# Patient Record
Sex: Female | Born: 1990 | Race: Black or African American | Marital: Single | Smoking: Current some day smoker
Health system: Southern US, Community
[De-identification: ages and names within clinical notes are randomized; demographics above are authoritative.]

## PROBLEM LIST (undated history)

## (undated) HISTORY — PX: ANKLE SURGERY: SHX546

---

## 2010-09-17 ENCOUNTER — Emergency Department (HOSPITAL_COMMUNITY): Payer: Medicaid Other

## 2010-09-17 ENCOUNTER — Emergency Department (HOSPITAL_COMMUNITY)
Admission: EM | Admit: 2010-09-17 | Discharge: 2010-09-17 | Disposition: A | Discharge status: Home / Self Care | Payer: Medicaid Other | Attending: Emergency Medicine | Admitting: Emergency Medicine

## 2010-09-17 DIAGNOSIS — N949 Unspecified condition associated with female genital organs and menstrual cycle: Secondary | ICD-10-CM | POA: Insufficient documentation

## 2010-09-17 DIAGNOSIS — R10819 Abdominal tenderness, unspecified site: Secondary | ICD-10-CM | POA: Insufficient documentation

## 2010-09-17 DIAGNOSIS — R102 Pelvic and perineal pain: Secondary | ICD-10-CM

## 2010-09-17 DIAGNOSIS — N898 Other specified noninflammatory disorders of vagina: Secondary | ICD-10-CM | POA: Insufficient documentation

## 2010-09-17 DIAGNOSIS — N72 Inflammatory disease of cervix uteri: Secondary | ICD-10-CM | POA: Insufficient documentation

## 2010-09-17 DIAGNOSIS — R109 Unspecified abdominal pain: Secondary | ICD-10-CM | POA: Insufficient documentation

## 2010-09-17 LAB — GC/CHLAMYDIA PROBE AMP, GENITAL: Chlamydia, DNA Probe: NEGATIVE

## 2010-09-17 LAB — WET PREP, GENITAL
Clue Cells Wet Prep HPF POC: NONE SEEN
Yeast Wet Prep HPF POC: NONE SEEN

## 2010-09-17 LAB — URINALYSIS, ROUTINE W REFLEX MICROSCOPIC
Hgb urine dipstick: NEGATIVE
Ketones, ur: NEGATIVE mg/dL
Protein, ur: NEGATIVE mg/dL
Urine Glucose, Fasting: NEGATIVE mg/dL
pH: 6.5 (ref 5.0–8.0)

## 2010-09-17 LAB — PREGNANCY, URINE: Preg Test, Ur: NEGATIVE

## 2011-03-23 ENCOUNTER — Emergency Department (HOSPITAL_COMMUNITY): Payer: Medicaid Other

## 2011-03-23 ENCOUNTER — Emergency Department (HOSPITAL_COMMUNITY)
Admission: EM | Admit: 2011-03-23 | Discharge: 2011-03-23 | Disposition: A | Discharge status: Home / Self Care | Payer: Medicaid Other | Attending: Emergency Medicine | Admitting: Emergency Medicine

## 2011-03-23 DIAGNOSIS — S335XXA Sprain of ligaments of lumbar spine, initial encounter: Secondary | ICD-10-CM | POA: Insufficient documentation

## 2011-03-23 DIAGNOSIS — X58XXXA Exposure to other specified factors, initial encounter: Secondary | ICD-10-CM | POA: Insufficient documentation

## 2011-03-23 DIAGNOSIS — M545 Low back pain, unspecified: Secondary | ICD-10-CM | POA: Insufficient documentation

## 2011-03-28 ENCOUNTER — Emergency Department (HOSPITAL_COMMUNITY)
Admission: EM | Admit: 2011-03-28 | Discharge: 2011-03-28 | Discharge status: Against Medical Advice | Payer: Medicaid Other | Attending: Emergency Medicine | Admitting: Emergency Medicine

## 2011-03-28 DIAGNOSIS — M549 Dorsalgia, unspecified: Secondary | ICD-10-CM | POA: Insufficient documentation

## 2011-03-28 LAB — URINE MICROSCOPIC-ADD ON

## 2011-03-28 LAB — URINALYSIS, ROUTINE W REFLEX MICROSCOPIC
Bilirubin Urine: NEGATIVE
Ketones, ur: NEGATIVE mg/dL
Nitrite: NEGATIVE
Specific Gravity, Urine: 1.003 — ABNORMAL LOW (ref 1.005–1.030)
pH: 6.5 (ref 5.0–8.0)

## 2011-03-28 LAB — POCT PREGNANCY, URINE: Preg Test, Ur: NEGATIVE

## 2017-04-08 ENCOUNTER — Encounter (HOSPITAL_COMMUNITY): Payer: Self-pay

## 2017-04-08 ENCOUNTER — Emergency Department (HOSPITAL_COMMUNITY)
Admission: EM | Admit: 2017-04-08 | Discharge: 2017-04-08 | Disposition: A | Discharge status: Home / Self Care | Payer: No Typology Code available for payment source | Attending: Emergency Medicine | Admitting: Emergency Medicine

## 2017-04-08 DIAGNOSIS — R5383 Other fatigue: Secondary | ICD-10-CM | POA: Diagnosis not present

## 2017-04-08 DIAGNOSIS — R110 Nausea: Secondary | ICD-10-CM | POA: Insufficient documentation

## 2017-04-08 DIAGNOSIS — Z5321 Procedure and treatment not carried out due to patient leaving prior to being seen by health care provider: Secondary | ICD-10-CM | POA: Insufficient documentation

## 2017-04-08 DIAGNOSIS — M545 Low back pain: Secondary | ICD-10-CM | POA: Insufficient documentation

## 2017-04-08 DIAGNOSIS — R51 Headache: Secondary | ICD-10-CM | POA: Diagnosis not present
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
## 2017-04-08 NOTE — ED Notes (Signed)
Pt Called from lobby with no response 

## 2017-04-10 ENCOUNTER — Encounter (HOSPITAL_COMMUNITY): Payer: Self-pay | Admitting: Emergency Medicine

## 2017-04-10 ENCOUNTER — Emergency Department (HOSPITAL_COMMUNITY)
Admission: EM | Admit: 2017-04-10 | Discharge: 2017-04-10 | Disposition: A | Discharge status: Home / Self Care | Payer: No Typology Code available for payment source | Attending: Emergency Medicine | Admitting: Emergency Medicine

## 2017-04-10 DIAGNOSIS — S161XXA Strain of muscle, fascia and tendon at neck level, initial encounter: Secondary | ICD-10-CM | POA: Diagnosis not present

## 2017-04-10 DIAGNOSIS — Y929 Unspecified place or not applicable: Secondary | ICD-10-CM | POA: Diagnosis not present

## 2017-04-10 DIAGNOSIS — M791 Myalgia: Secondary | ICD-10-CM | POA: Diagnosis present

## 2017-04-10 DIAGNOSIS — Y999 Unspecified external cause status: Secondary | ICD-10-CM | POA: Diagnosis not present

## 2017-04-10 DIAGNOSIS — F17210 Nicotine dependence, cigarettes, uncomplicated: Secondary | ICD-10-CM | POA: Diagnosis not present

## 2017-04-10 DIAGNOSIS — G44209 Tension-type headache, unspecified, not intractable: Secondary | ICD-10-CM | POA: Diagnosis not present

## 2017-04-10 DIAGNOSIS — S39012A Strain of muscle, fascia and tendon of lower back, initial encounter: Secondary | ICD-10-CM | POA: Insufficient documentation

## 2017-04-10 DIAGNOSIS — T148XXA Other injury of unspecified body region, initial encounter: Secondary | ICD-10-CM

## 2017-04-10 DIAGNOSIS — Y939 Activity, unspecified: Secondary | ICD-10-CM | POA: Insufficient documentation

## 2017-04-10 MED ORDER — NAPROXEN 500 MG PO TABS
500.0000 mg | ORAL_TABLET | Freq: Once | ORAL | Status: AC
  Administered 2017-04-10: 500 mg via ORAL
  Filled 2017-04-10: qty 1

## 2017-04-10 MED ORDER — NAPROXEN 500 MG PO TABS: 500.0000 mg | ORAL_TABLET | Freq: Two times a day (BID) | ORAL | 0 refills | Status: AC
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
## 2019-01-11 DIAGNOSIS — M79645 Pain in left finger(s): Secondary | ICD-10-CM | POA: Insufficient documentation

## 2019-04-04 DIAGNOSIS — M7651 Patellar tendinitis, right knee: Secondary | ICD-10-CM | POA: Insufficient documentation

## 2019-09-14 ENCOUNTER — Emergency Department (HOSPITAL_COMMUNITY)
Admission: EM | Admit: 2019-09-14 | Discharge: 2019-09-15 | Disposition: A | Discharge status: Home / Self Care | Payer: 59 | Attending: Emergency Medicine | Admitting: Emergency Medicine

## 2019-09-14 ENCOUNTER — Emergency Department (HOSPITAL_COMMUNITY): Payer: 59

## 2019-09-14 DIAGNOSIS — Y999 Unspecified external cause status: Secondary | ICD-10-CM | POA: Diagnosis not present

## 2019-09-14 DIAGNOSIS — Z789 Other specified health status: Secondary | ICD-10-CM

## 2019-09-14 DIAGNOSIS — T07XXXA Unspecified multiple injuries, initial encounter: Secondary | ICD-10-CM | POA: Diagnosis present

## 2019-09-14 DIAGNOSIS — Y929 Unspecified place or not applicable: Secondary | ICD-10-CM | POA: Diagnosis not present

## 2019-09-14 DIAGNOSIS — Y939 Activity, unspecified: Secondary | ICD-10-CM | POA: Diagnosis not present

## 2019-09-14 DIAGNOSIS — Z20822 Contact with and (suspected) exposure to covid-19: Secondary | ICD-10-CM | POA: Insufficient documentation

## 2019-09-14 DIAGNOSIS — S62323A Displaced fracture of shaft of third metacarpal bone, left hand, initial encounter for closed fracture: Secondary | ICD-10-CM | POA: Insufficient documentation

## 2019-09-14 DIAGNOSIS — S12041A Nondisplaced lateral mass fracture of first cervical vertebra, initial encounter for closed fracture: Secondary | ICD-10-CM | POA: Diagnosis not present

## 2019-09-14 DIAGNOSIS — R519 Headache, unspecified: Secondary | ICD-10-CM | POA: Diagnosis not present

## 2019-09-14 DIAGNOSIS — R110 Nausea: Secondary | ICD-10-CM | POA: Insufficient documentation

## 2019-09-14 LAB — BASIC METABOLIC PANEL
Anion gap: 12 (ref 5–15)
BUN: 14 mg/dL (ref 6–20)
CO2: 20 mmol/L — ABNORMAL LOW (ref 22–32)
Calcium: 9.1 mg/dL (ref 8.9–10.3)
Chloride: 104 mmol/L (ref 98–111)
Creatinine, Ser: 0.97 mg/dL (ref 0.44–1.00)
GFR calc Af Amer: >60 mL/min (ref >60)
GFR calc non Af Amer: >60 mL/min (ref >60)
Glucose, Bld: 82 mg/dL (ref 70–99)
Potassium: 4.4 mmol/L (ref 3.5–5.1)
Sodium: 136 mmol/L (ref 135–145)

## 2019-09-14 LAB — CBC
HCT: 41.3 % (ref 36.0–46.0)
Hemoglobin: 13.6 g/dL (ref 12.0–15.0)
MCH: 30.5 pg (ref 26.0–34.0)
MCHC: 32.9 g/dL (ref 30.0–36.0)
MCV: 92.6 fL (ref 80.0–100.0)
Platelets: 188 10*3/uL (ref 150–400)
RBC: 4.46 MIL/uL (ref 3.87–5.11)
RDW: 13.2 % (ref 11.5–15.5)
WBC: 7.7 10*3/uL (ref 4.0–10.5)
nRBC: 0 % (ref 0.0–0.2)

## 2019-09-14 LAB — RESPIRATORY PANEL BY RT PCR (FLU A&B, COVID)
Influenza A by PCR: NEGATIVE
Influenza B by PCR: NEGATIVE
SARS Coronavirus 2 by RT PCR: NEGATIVE

## 2019-09-14 LAB — I-STAT BETA HCG BLOOD, ED (MC, WL, AP ONLY): I-stat hCG, quantitative: <5 m[IU]/mL (ref <5)

## 2019-09-14 MED ORDER — MORPHINE SULFATE (PF) 4 MG/ML IV SOLN
4.0000 mg | Freq: Once | INTRAVENOUS | Status: AC
  Administered 2019-09-14: 4 mg via INTRAVENOUS
  Filled 2019-09-14: qty 1

## 2019-09-14 MED ORDER — PROMETHAZINE HCL 25 MG/ML IJ SOLN
12.5000 mg | Freq: Once | INTRAMUSCULAR | Status: AC
  Administered 2019-09-14: 25 mg via INTRAVENOUS
  Filled 2019-09-14: qty 1

## 2019-09-14 MED ORDER — MORPHINE SULFATE (PF) 4 MG/ML IV SOLN
4.0000 mg | Freq: Once | INTRAVENOUS | Status: AC
  Administered 2019-09-14: 20:00:00 4 mg via INTRAVENOUS
  Filled 2019-09-14: qty 1

## 2019-09-14 MED ORDER — IOHEXOL 300 MG/ML  SOLN
100.0000 mL | Freq: Once | INTRAMUSCULAR | Status: AC | PRN
  Administered 2019-09-14: 100 mL via INTRAVENOUS

## 2019-09-14 MED ORDER — HYDROCODONE-ACETAMINOPHEN 5-325 MG PO TABS: 1.0000 | ORAL_TABLET | Freq: Four times a day (QID) | ORAL | 0 refills | Status: AC | PRN

## 2019-09-14 MED ORDER — SODIUM CHLORIDE 0.9 % IV BOLUS
500.0000 mL | Freq: Once | INTRAVENOUS | Status: AC
  Administered 2019-09-14: 20:00:00 500 mL via INTRAVENOUS

## 2019-09-14 MED ORDER — ONDANSETRON 4 MG PO TBDP: 4.0000 mg | ORAL_TABLET | Freq: Three times a day (TID) | ORAL | 0 refills | Status: AC | PRN

## 2019-09-14 MED ORDER — ONDANSETRON HCL 4 MG/2ML IJ SOLN
4.0000 mg | Freq: Once | INTRAMUSCULAR | Status: AC
  Administered 2019-09-14: 20:00:00 4 mg via INTRAVENOUS
  Filled 2019-09-14: qty 2
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
## 2019-09-14 NOTE — ED Notes (Signed)
Pt transported to CT ?

## 2019-09-14 NOTE — Discharge Instructions (Addendum)
As we discussed today you need to wear the neck brace at all times. After you shower you may lay down flat on your back and with you not moving have someone take your collar off, change the pads and replace it all without you moving.   Then hang the pads up to dry and repeat on a daily basis.  Your splint on your hand can't wet.    Please take Ibuprofen (Advil, motrin) and Tylenol (acetaminophen) to relieve your pain.  You may take up to 600 MG (3 pills) of normal strength ibuprofen every 8 hours as needed.  In between doses of ibuprofen you make take tylenol, up to 1,000 mg (two extra strength pills).  Do not take more than 3,000 mg tylenol in a 24 hour period.  Please check all medication labels as many medications such as pain and cold medications may contain tylenol.  Do not drink alcohol while taking these medications.  Do not take other NSAID'S while taking ibuprofen (such as aleve or naproxen).  Please take ibuprofen with food to decrease stomach upset.  Today you received medications that may make you sleepy or impair your ability to make decisions.  For the next 24 hours please do not drive, operate heavy machinery, care for a small child with out another adult present, or perform any activities that may cause harm to you or someone else if you were to fall asleep or be impaired.   You are being prescribed a medication which may make you sleepy. Please follow up of listed precautions for at least 24 hours after taking one dose.
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
## 2019-09-15 NOTE — ED Notes (Signed)
Patient verbalizes understanding of discharge instructions. Opportunity for questioning and answers were provided. Armband removed by staff, pt discharged from ED.  

## 2019-09-15 NOTE — Progress Notes (Signed)
Orthopedic Tech Progress Note Patient Details:  Jenavee Savoury 01-07-1991 [id number]  Ortho Devices Type of Ortho Device: Arm sling, Volar splint Ortho Device/Splint Location: lue Ortho Device/Splint Interventions: Ordered, Application, Adjustment   Post Interventions Patient Tolerated: Well Instructions Provided: Care of device, Adjustment of device   Trinna Post 09/15/2019, 4:06 AM

## 2019-10-02 DIAGNOSIS — S62308A Unspecified fracture of other metacarpal bone, initial encounter for closed fracture: Secondary | ICD-10-CM | POA: Insufficient documentation

## 2019-12-02 DIAGNOSIS — M436 Torticollis: Secondary | ICD-10-CM | POA: Insufficient documentation

## 2019-12-02 DIAGNOSIS — M542 Cervicalgia: Secondary | ICD-10-CM | POA: Insufficient documentation
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
## 2022-03-14 ENCOUNTER — Other Ambulatory Visit: Payer: Self-pay

## 2022-03-14 ENCOUNTER — Emergency Department (HOSPITAL_COMMUNITY)
Admission: EM | Admit: 2022-03-14 | Discharge: 2022-03-15 | Disposition: A | Discharge status: Home / Self Care | Payer: No Typology Code available for payment source | Attending: Emergency Medicine | Admitting: Emergency Medicine

## 2022-03-14 DIAGNOSIS — R6884 Jaw pain: Secondary | ICD-10-CM | POA: Insufficient documentation

## 2022-03-14 DIAGNOSIS — S161XXA Strain of muscle, fascia and tendon at neck level, initial encounter: Secondary | ICD-10-CM | POA: Diagnosis not present

## 2022-03-14 DIAGNOSIS — R519 Headache, unspecified: Secondary | ICD-10-CM | POA: Diagnosis not present

## 2022-03-14 DIAGNOSIS — M8440XA Pathological fracture, unspecified site, initial encounter for fracture: Secondary | ICD-10-CM

## 2022-03-14 DIAGNOSIS — S12031A Nondisplaced posterior arch fracture of first cervical vertebra, initial encounter for closed fracture: Secondary | ICD-10-CM | POA: Diagnosis not present

## 2022-03-14 DIAGNOSIS — Y92410 Unspecified street and highway as the place of occurrence of the external cause: Secondary | ICD-10-CM | POA: Diagnosis not present

## 2022-03-14 DIAGNOSIS — S169XXA Unspecified injury of muscle, fascia and tendon at neck level, initial encounter: Secondary | ICD-10-CM | POA: Diagnosis present

## 2022-03-14 DIAGNOSIS — T148XXA Other injury of unspecified body region, initial encounter: Secondary | ICD-10-CM

## 2022-03-14 MED ORDER — IBUPROFEN 800 MG PO TABS
800.0000 mg | ORAL_TABLET | Freq: Once | ORAL | Status: AC
  Administered 2022-03-15: 800 mg via ORAL
  Filled 2022-03-14: qty 1
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
## 2022-03-14 NOTE — ED Provider Notes (Incomplete)
  [facility] Emergency Department Provider Note MRN:  [id number]  Arrival date & time: 03/14/22     Chief Complaint   No chief complaint on file.   History of Present Illness   Dawn Hester is a 31 y.o. year-old female presents to the ED with chief complaint of ***.  {rbhistorian:27070} {RB interpreter (Optional):27221}  Review of Systems  Pertinent positive and negative review of systems noted in HPI.    Physical Exam   Vitals:   03/14/22 2346  BP: 139/80  Pulse: 72  Resp: 16  Temp: 98.3 F (36.8 C)  SpO2: 98%    CONSTITUTIONAL:  ***-appearing, NAD NEURO:  Alert and oriented x 3, CN 3-12 grossly intact*** EYES:  eyes equal and reactive ENT/NECK:  Supple, no stridor *** CARDIO:  ***, ***regular rhythm, appears well-perfused *** PULM:  No respiratory distress***, *** GI/GU:  non-distended, *** MSK/SPINE:  No gross deformities, no edema, moves all extremities *** SKIN:  no rash, atraumatic***   *Additional and/or pertinent findings included in MDM below  Diagnostic and Interventional Summary    EKG Interpretation  Date/Time:    Ventricular Rate:    PR Interval:    QRS Duration:   QT Interval:    QTC Calculation:   R Axis:     Text Interpretation:        *** Labs Reviewed - No data to display  CT HEAD WO CONTRAST ( )    (Results Pending)  CT Cervical Spine Wo Contrast    (Results Pending)  CT Maxillofacial Wo Contrast    (Results Pending)    Medications  ibuprofen (ADVIL) tablet 800 mg (has no administration in time range)     Procedures  /  Critical Care Procedures  ED Course and Medical Decision Making  I have reviewed the triage vital signs, the nursing notes, and pertinent available records from the EMR.  Social Determinants Affecting Complexity of Care: Patient {rbSocial Determinants:27067}. {rbsocialsolutions:27068}  ED Course:    Medical Decision Making Amount and/or Complexity of Data  Reviewed Radiology: ordered.  Risk Prescription drug management.     Consultants: {rbconsultants:27072}   Treatment and Plan: {rbadmissionvdc:27069}  {rbattending:27073}  Final Clinical Impressions(s) / ED Diagnoses  No diagnosis found.  ED Discharge Orders     None         Discharge Instructions Discussed with and Provided to Patient:   Discharge Instructions   None

## 2022-03-15 ENCOUNTER — Encounter (HOSPITAL_COMMUNITY): Payer: Self-pay | Admitting: *Deleted

## 2022-03-15 ENCOUNTER — Other Ambulatory Visit: Payer: Self-pay

## 2022-03-15 ENCOUNTER — Emergency Department (HOSPITAL_COMMUNITY): Payer: No Typology Code available for payment source

## 2022-03-15 MED ORDER — CYCLOBENZAPRINE HCL 10 MG PO TABS: 10.0000 mg | ORAL_TABLET | Freq: Three times a day (TID) | ORAL | 0 refills | Status: AC | PRN

## 2022-03-15 MED ORDER — IBUPROFEN 600 MG PO TABS: 600.0000 mg | ORAL_TABLET | Freq: Four times a day (QID) | ORAL | 0 refills | Status: AC | PRN
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
## 2024-03-26 ENCOUNTER — Encounter: Payer: Self-pay | Admitting: Emergency Medicine

## 2024-03-26 ENCOUNTER — Other Ambulatory Visit: Payer: Self-pay

## 2024-03-26 ENCOUNTER — Ambulatory Visit
Admission: EM | Admit: 2024-03-26 | Discharge: 2024-03-26 | Disposition: A | Discharge status: Home / Self Care | Payer: Self-pay

## 2024-03-26 DIAGNOSIS — R051 Acute cough: Secondary | ICD-10-CM

## 2024-03-26 DIAGNOSIS — B349 Viral infection, unspecified: Secondary | ICD-10-CM

## 2024-03-26 DIAGNOSIS — R0789 Other chest pain: Secondary | ICD-10-CM

## 2024-03-26 MED ORDER — ALBUTEROL SULFATE HFA 108 (90 BASE) MCG/ACT IN AERS: 2.0000 | INHALATION_SPRAY | RESPIRATORY_TRACT | 0 refills | Status: AC | PRN

## 2024-03-26 NOTE — ED Provider Notes (Signed)
 [facility]    CSN: [id number] Arrival date & time: 03/26/24  1053      History   Chief Complaint Chief Complaint  Patient presents with   Fatigue    HPI Dawn Hester is a 33 y.o. female.   Patient is here requesting evaluation for the new onset of a cough, sore throat, chest tightness, and episodic diarrhea (3-4 non bloody stools) since yesterday.  Reports that she felt fatigued this morning while doing her routine activities.  Took an OTC Theraflu medication to see if that would help with symptom management.  No overt chest pain, abdominal pain, or vomiting.  She did endorse feeling nauseated.  No recent long trips/airplane travel.  Was able to work out at the gym yesterday.  No lower extremity/calf tenderness, erythema, or warmth bilaterally.  She does have the implant in her arm for contraception.    The history is provided by the patient.    History reviewed. No pertinent past medical history.  Patient Active Problem List   Diagnosis Date Noted   Stiff neck 12/02/2019   Neck pain 12/02/2019   Closed fracture of third metacarpal bone 10/02/2019   Patellar tendinitis, right knee 04/04/2019   Pain in finger of left hand 01/11/2019    Past Surgical History:  Procedure Laterality Date   ANKLE SURGERY     2009, 2012    OB History   No obstetric history on file.      Home Medications    Prior to Admission medications   Medication Sig Start Date End Date Taking? Authorizing Provider  albuterol  (VENTOLIN  HFA) 108 (90 Base) MCG/ACT inhaler Inhale 2 puffs into the lungs every 4 (four) hours as needed (chest tightness). 03/26/24  Yes Janet Therisa PARAS, FNP  acetaminophen  (TYLENOL ) 500 MG tablet Take 1,000 mg by mouth every 6 (six) hours as needed (for headaches).    [provider]  cyclobenzaprine  (FLEXERIL ) 10 MG tablet Take 1 tablet (10 mg total) by mouth 3 (three) times daily as needed for muscle spasms. 03/15/22   Vicky Charleston, PA-C   HYDROcodone -acetaminophen  (NORCO/VICODIN) 5-325 MG tablet Take 1 tablet by mouth every 6 (six) hours as needed for severe pain. Patient not taking: Reported on 03/26/2024 09/14/19   Windle Almarie ORN, PA-C  ibuprofen  (ADVIL ) 600 MG tablet Take 1 tablet (600 mg total) by mouth every 6 (six) hours as needed. 03/15/22   Vicky Charleston, PA-C  methocarbamol (ROBAXIN) 500 MG tablet Take 500 mg by mouth every 6 (six) hours as needed.    [provider]  naproxen  (NAPROSYN ) 500 MG tablet Take 1 tablet (500 mg total) by mouth 2 (two) times daily. Patient not taking: Reported on 09/14/2019 04/10/17   Horton, Charmaine FALCON, MD  naproxen  sodium (ALEVE ) 220 MG tablet Take 220-660 mg by mouth 2 (two) times daily as needed (as needed for headaches).    [provider]  NEOMYCIN-POLYMYXIN-DEXAMETH OP USE INTO DROP INTO RIGHT EYE 4 TIMES A DAY FOR 4 DAYS THEN TWICE DAILY FOR 4 DAYS    [provider]  norethindrone-ethinyl estradiol-FE (LOESTRIN FE) 1-20 MG-MCG tablet Take 1 tablet by mouth daily.    [provider]  norgestrel-ethinyl estradiol (CRYSELLE-28) 0.3-30 MG-MCG tablet Take 1 tablet by mouth daily.    [provider]  ondansetron  (ZOFRAN  ODT) 4 MG disintegrating tablet Take 1 tablet (4 mg total) by mouth every 8 (eight) hours as needed for nausea or vomiting. 09/14/19   Windle Almarie ORN, PA-C  Family History History reviewed. No pertinent family history.  Social History Social History   Tobacco Use   Smoking status: Some Days   Smokeless tobacco: Never  Vaping Use   Vaping status: Never Used  Substance Use Topics   Alcohol use: No   Drug use: No     Allergies   No known allergies   Review of Systems Review of Systems  Constitutional:  Positive for fatigue. Negative for chills and fever.  HENT:  Positive for sore throat. Negative for congestion and rhinorrhea.   Eyes:  Negative for pain and redness.  Respiratory:  Positive for cough and  chest tightness. Negative for shortness of breath.   Cardiovascular:  Negative for chest pain.  Gastrointestinal:  Positive for diarrhea. Negative for abdominal pain and vomiting.  Genitourinary:  Negative for dysuria.  Musculoskeletal:  Negative for back pain and myalgias.  Skin:  Negative for rash.  Neurological:  Negative for dizziness and headaches.  Psychiatric/Behavioral:  Negative for behavioral problems.      Physical Exam Triage Vital Signs ED Triage Vitals [03/26/24 1104]  Encounter Vitals Group     BP (!) 162/85     Girls Systolic BP Percentile      Girls Diastolic BP Percentile      Boys Systolic BP Percentile      Boys Diastolic BP Percentile      Pulse Rate 74     Resp (!) 22     Temp 98.1 F (36.7 C)     Temp Source Oral     SpO2 100 %     Weight      Height      Head Circumference      Peak Flow      Pain Score 0     Pain Loc      Pain Education      Exclude from Growth Chart    No data found.  Updated Vital Signs BP 123/63 (BP Location: Right Arm)   Pulse 74   Temp 98.1 F (36.7 C) (Oral)   Resp 18   SpO2 100%      Physical Exam Vitals and nursing note reviewed.  Constitutional:      Appearance: Normal appearance.  HENT:     Head: Normocephalic.     Right Ear: There is impacted cerumen.     Left Ear: There is impacted cerumen.     Nose: No congestion or rhinorrhea.     Mouth/Throat:     Mouth: Mucous membranes are moist.     Pharynx: No posterior oropharyngeal erythema.  Eyes:     Conjunctiva/sclera: Conjunctivae normal.     Pupils: Pupils are equal, round, and reactive to light.  Cardiovascular:     Rate and Rhythm: Normal rate and regular rhythm.     Heart sounds: Normal heart sounds.  Pulmonary:     Effort: Pulmonary effort is normal.     Breath sounds: Normal breath sounds. No wheezing, rhonchi or rales.  Abdominal:     General: Bowel sounds are normal.  Musculoskeletal:        General: Normal range of motion.     Comments:  Negative Homan's sign bilaterally.  Skin:    General: Skin is warm and dry.  Neurological:     General: No focal deficit present.     Mental Status: She is alert and oriented to person, place, and time.  Psychiatric:        Mood and Affect: Mood  normal.        Behavior: Behavior normal.        Thought Content: Thought content normal.        Judgment: Judgment normal.    UC Treatments / Results  Labs (all labs ordered are listed, but only abnormal results are displayed) Labs Reviewed - No data to display  EKG   Radiology No results found.  Procedures Procedures (including critical care time)  Medications Ordered in UC Medications - No data to display  Initial Impression / Assessment and Plan / UC Course  I have reviewed the triage vital signs and the nursing notes.  Pertinent labs & imaging results that were available during my care of the patient were reviewed by me and considered in my medical decision making (see chart for details).     Patient presents for evaluation of a cough, chest tightness, sore throat, and diarrhea that started yesterday.  Physical exam findings are reassuring.  Low suspicion for cardiolpulmonary etiologies of her symptoms (I.e. DVT, PE).  Her vital signs are stable.  Discussed this is likely a viral syndrome and can be treated with OTC medications such as Tylenol , Motrin , and cold/flu medications as needed.  Ensure adequate rest and oral hydration.  Work note provided.  I've also prescribed an Albuterol  inhaler to help with symptom management.    For the bilateral cerumen impaction, recommended OTC Debrox.  Final Clinical Impressions(s) / UC Diagnoses   Final diagnoses:  Acute viral syndrome  Acute cough  Feeling of chest tightness     Discharge Instructions      An Albuterol  inhaler has been sent to the pharmacy to help with the sensation of chest tightness. May continue OTC medications as needed such as tylenol , motrin , and cough/cold  products. Ensure adequate rest and oral hydration.     ED Prescriptions     Medication Sig Dispense Auth. Provider   albuterol  (VENTOLIN  HFA) 108 (90 Base) MCG/ACT inhaler Inhale 2 puffs into the lungs every 4 (four) hours as needed (chest tightness). 18 g Janet Therisa PARAS, FNP      PDMP not reviewed this encounter.   Janet Therisa PARAS, FNP 03/26/24 [phone]

## 2024-03-26 NOTE — Discharge Instructions (Signed)
 An Albuterol  inhaler has been sent to the pharmacy to help with the sensation of chest tightness. May continue OTC medications as needed such as tylenol , motrin , and cough/cold products. Ensure adequate rest and oral hydration.

## 2024-03-26 NOTE — ED Triage Notes (Signed)
 Pt here for increased feeling of SOB; pt sts fatigue and not feeling well x 2 days; pt tearful and appears anxious
